# Patient Record
Sex: Male | Born: 1980 | Race: White | Hispanic: No | Smoking: Never smoker
Health system: Southern US, Community
[De-identification: ages and names within clinical notes are randomized; demographics above are authoritative.]

## PROBLEM LIST (undated history)

## (undated) DIAGNOSIS — E785 Hyperlipidemia, unspecified: Secondary | ICD-10-CM

## (undated) DIAGNOSIS — R208 Other disturbances of skin sensation: Secondary | ICD-10-CM

## (undated) HISTORY — DX: Hyperlipidemia, unspecified: E78.5

## (undated) HISTORY — DX: Other disturbances of skin sensation: R20.8

---
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
## 1999-09-14 ENCOUNTER — Encounter: Payer: Self-pay | Admitting: Orthopedic Surgery

## 1999-09-14 ENCOUNTER — Ambulatory Visit (HOSPITAL_COMMUNITY): Admission: RE | Admit: 1999-09-14 | Discharge: 1999-09-14 | Payer: Self-pay | Admitting: Orthopedic Surgery

## 1999-12-26 HISTORY — PX: WRIST SURGERY: SHX841

## 2000-01-04 ENCOUNTER — Ambulatory Visit (HOSPITAL_BASED_OUTPATIENT_CLINIC_OR_DEPARTMENT_OTHER): Admission: RE | Admit: 2000-01-04 | Discharge: 2000-01-04 | Payer: Self-pay | Admitting: Orthopedic Surgery

## 2000-03-07 ENCOUNTER — Emergency Department (HOSPITAL_COMMUNITY): Admission: EM | Admit: 2000-03-07 | Discharge: 2000-03-08 | Payer: Self-pay | Admitting: Emergency Medicine

## 2000-03-07 ENCOUNTER — Encounter: Payer: Self-pay | Admitting: Emergency Medicine

## 2000-05-09 ENCOUNTER — Emergency Department (HOSPITAL_COMMUNITY): Admission: EM | Admit: 2000-05-09 | Discharge: 2000-05-10 | Payer: Self-pay | Admitting: *Deleted

## 2003-07-27 ENCOUNTER — Emergency Department (HOSPITAL_COMMUNITY): Admission: EM | Admit: 2003-07-27 | Discharge: 2003-07-27 | Payer: Self-pay | Admitting: Emergency Medicine

## 2004-05-27 HISTORY — PX: SHOULDER SURGERY: SHX246

## 2004-11-13 ENCOUNTER — Ambulatory Visit (HOSPITAL_COMMUNITY): Admission: RE | Admit: 2004-11-13 | Discharge: 2004-11-13 | Payer: Self-pay | Admitting: Specialist

## 2005-03-01 ENCOUNTER — Emergency Department: Payer: Self-pay | Admitting: Emergency Medicine

## 2006-05-07 ENCOUNTER — Encounter: Payer: Self-pay | Admitting: Family Medicine

## 2006-10-16 ENCOUNTER — Emergency Department: Payer: Self-pay | Admitting: Emergency Medicine

## 2007-04-09 ENCOUNTER — Ambulatory Visit: Payer: Self-pay | Admitting: Family Medicine

## 2007-04-09 DIAGNOSIS — E669 Obesity, unspecified: Secondary | ICD-10-CM | POA: Insufficient documentation

## 2007-04-09 DIAGNOSIS — E785 Hyperlipidemia, unspecified: Secondary | ICD-10-CM

## 2007-04-09 DIAGNOSIS — M1250 Traumatic arthropathy, unspecified site: Secondary | ICD-10-CM | POA: Insufficient documentation

## 2007-04-16 ENCOUNTER — Ambulatory Visit: Payer: Self-pay | Admitting: Family Medicine

## 2007-04-21 LAB — CONVERTED CEMR LAB
ALT: 40 units/L (ref 0–53)
AST: 32 units/L (ref 0–37)
Albumin: 4.2 g/dL (ref 3.5–5.2)
Alkaline Phosphatase: 96 units/L (ref 39–117)
BUN: 11 mg/dL (ref 6–23)
Bilirubin, Direct: 0.1 mg/dL (ref 0.0–0.3)
CO2: 28 meq/L (ref 19–32)
Calcium: 9.2 mg/dL (ref 8.4–10.5)
Chloride: 104 meq/L (ref 96–112)
Cholesterol: 193 mg/dL (ref 0–200)
Creatinine, Ser: 0.8 mg/dL (ref 0.4–1.5)
GFR calc Af Amer: 149 mL/min
GFR calc non Af Amer: 123 mL/min
Glucose, Bld: 99 mg/dL (ref 70–99)
HDL: 33.7 mg/dL — ABNORMAL LOW (ref >39.0)
LDL Cholesterol: 129 mg/dL — ABNORMAL HIGH (ref 0–99)
Potassium: 3.8 meq/L (ref 3.5–5.1)
Sodium: 140 meq/L (ref 135–145)
Total Bilirubin: 1.2 mg/dL (ref 0.3–1.2)
Total CHOL/HDL Ratio: 5.7
Total Protein: 7.2 g/dL (ref 6.0–8.3)
Triglycerides: 152 mg/dL — ABNORMAL HIGH (ref 0–149)
VLDL: 30 mg/dL (ref 0–40)

## 2007-10-01 ENCOUNTER — Telehealth: Payer: Self-pay | Admitting: Family Medicine

## 2007-11-26 ENCOUNTER — Ambulatory Visit: Payer: Self-pay | Admitting: Family Medicine

## 2008-02-15 ENCOUNTER — Ambulatory Visit: Payer: Self-pay | Admitting: Family Medicine

## 2008-03-17 ENCOUNTER — Telehealth: Payer: Self-pay | Admitting: Family Medicine

## 2008-07-29 ENCOUNTER — Telehealth: Payer: Self-pay | Admitting: Family Medicine

## 2008-09-06 ENCOUNTER — Ambulatory Visit: Payer: Self-pay | Admitting: Family Medicine

## 2008-10-05 ENCOUNTER — Telehealth: Payer: Self-pay | Admitting: Family Medicine

## 2008-10-06 ENCOUNTER — Ambulatory Visit: Payer: Self-pay | Admitting: Family Medicine

## 2008-11-23 ENCOUNTER — Ambulatory Visit: Payer: Self-pay | Admitting: Family Medicine

## 2008-11-23 DIAGNOSIS — K219 Gastro-esophageal reflux disease without esophagitis: Secondary | ICD-10-CM

## 2008-12-23 ENCOUNTER — Telehealth: Payer: Self-pay | Admitting: Family Medicine

## 2009-02-17 ENCOUNTER — Telehealth: Payer: Self-pay | Admitting: Family Medicine

## 2009-03-21 ENCOUNTER — Telehealth: Payer: Self-pay | Admitting: Family Medicine

## 2009-04-13 ENCOUNTER — Ambulatory Visit: Payer: Self-pay | Admitting: Family Medicine

## 2009-05-02 ENCOUNTER — Telehealth: Payer: Self-pay | Admitting: Family Medicine

## 2009-08-31 ENCOUNTER — Encounter: Payer: Self-pay | Admitting: Family Medicine

## 2009-08-31 ENCOUNTER — Emergency Department (HOSPITAL_COMMUNITY): Admission: EM | Admit: 2009-08-31 | Discharge: 2009-08-31 | Payer: Self-pay | Admitting: Emergency Medicine

## 2009-09-01 ENCOUNTER — Ambulatory Visit: Payer: Self-pay | Admitting: Family Medicine

## 2009-09-06 ENCOUNTER — Telehealth: Payer: Self-pay | Admitting: Family Medicine

## 2009-09-08 ENCOUNTER — Ambulatory Visit: Payer: Self-pay | Admitting: Cardiology

## 2009-10-10 ENCOUNTER — Emergency Department: Payer: Self-pay | Admitting: Internal Medicine

## 2009-10-12 ENCOUNTER — Emergency Department: Payer: Self-pay | Admitting: Emergency Medicine

## 2009-11-06 ENCOUNTER — Ambulatory Visit (HOSPITAL_COMMUNITY): Admission: RE | Admit: 2009-11-06 | Discharge: 2009-11-06 | Payer: Self-pay | Admitting: Cardiology

## 2009-11-06 ENCOUNTER — Ambulatory Visit: Payer: Self-pay

## 2009-11-06 ENCOUNTER — Ambulatory Visit: Payer: Self-pay | Admitting: Cardiovascular Disease

## 2009-11-06 ENCOUNTER — Ambulatory Visit: Payer: Self-pay | Admitting: Family Medicine

## 2009-11-06 ENCOUNTER — Encounter: Payer: Self-pay | Admitting: Cardiology

## 2009-11-06 LAB — CONVERTED CEMR LAB
Bilirubin Urine: NEGATIVE
Glucose, Urine, Semiquant: NEGATIVE
Ketones, urine, test strip: NEGATIVE
Specific Gravity, Urine: 1.015
pH: 6.5

## 2010-01-03 ENCOUNTER — Ambulatory Visit: Payer: Self-pay | Admitting: Family Medicine

## 2010-01-08 ENCOUNTER — Ambulatory Visit: Payer: Self-pay | Admitting: Family Medicine

## 2010-01-08 LAB — CONVERTED CEMR LAB
ALT: 16 units/L (ref 0–53)
AST: 18 units/L (ref 0–37)
Albumin: 4 g/dL (ref 3.5–5.2)
BUN: 11 mg/dL (ref 6–23)
CO2: 29 meq/L (ref 19–32)
Chloride: 106 meq/L (ref 96–112)
Cholesterol: 149 mg/dL (ref 0–200)
Glucose, Bld: 91 mg/dL (ref 70–99)
Potassium: 4.3 meq/L (ref 3.5–5.1)
Total Bilirubin: 0.4 mg/dL (ref 0.3–1.2)
Total Protein: 6.6 g/dL (ref 6.0–8.3)

## 2010-06-28 NOTE — Assessment & Plan Note (Signed)
Summary: NEW PT   Primary Provider:  Dr. Dayton Martes  CC:  Syncope/Chest Pain w/ (L) Arm radiation.  History of Present Illness: 30 yo presents for evaluation of chest pain and presyncope.  Patient has had 4 episodes of chest tightness in the last few weeks.  All have been severe and have been with exertion.  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  It has lasted for 10-20 minutes and resolved with rest. He has never had these symptoms before.  In general, he has been in good physical condition.  He works outdoors daily and gets a lot of exercise.  During one episode of chest pain, he actually passed out.  During another episode, he was lightheaded.  He has no history of premature coronary disease.  He went to the ER 4/7 after one of these episodes.  CXR and ECG were normal.  No blood was drawn.  Of note, he has been distraught recently because his wife left him and is trying to keep him from seeing his daughter.    ECG: NSR, right axis deviation, T wave inversions in III and AVF  Current Medications (verified): 1)  Aspirin Ec 325 Mg Tbec (Aspirin) .... Take One Tablet By Mouth Daily (Takes Sometime Three Times A Day)  Allergies: 1)  ! Hydrocodone-Acetaminophen (Hydrocodone-Acetaminophen) 2)  ! Codeine 3)  ! Septra 4)  ! Benadryl  Past History:  Past Medical History: 1. Hyperlipidemia 2. Chest pain  Family History: father: alcoholic.  Died in his 46s.  Had MI in early 55s.   mother age 63 healthy borhter and sister with high chol MGF: melanoma PGF: DM MGM: schizophrenia, bipolar Uncle with MI in his 73s Grandfather with MI.   Social History: 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  Never Smoked Alcohol use-yes, 1 beer every 6 months Drug use-no Regular exercise-yes, yard work Diet: eats a lot of fast food  Review of Systems       All systems reviewed and negative except as  per HPI.   Vital Signs:  Patient profile:   30 year old male Height:      71 inches Weight:      222 pounds BMI:     31.07 Pulse rate:   113 / minute BP sitting:   134 / 84  (left arm)  Vitals Entered By: Stanton Kidney, EMT-P (September 08, 2009 3:06 PM)  Serial Vital Signs/Assessments:  Time      Position  BP       Pulse  Resp  Temp     By 3:18 PM             133/89   103                   Stanton Kidney, EMT-P 3:18 PM             142/95   118                   Stanton Kidney, EMT-P 3:19 PM             132/95   125                   Stanton Kidney, EMT-P 3:21 PM             134/89   131  Stanton Kidney, EMT-P 3:22 PM             144/106  108                   Stanton Kidney, EMT-P  Comments: 3:18 PM Laying (L) Arm By: Stanton Kidney, EMT-P  3:18 PM Sitting (L) Arm By: Stanton Kidney, EMT-P  3:19 PM 0 min Standing (L) Arm By: Stanton Kidney, EMT-P  3:21 PM 2 min Standing (L) Arm By: Stanton Kidney, EMT-P  3:22 PM 5 min Standing (L) Arm By: Stanton Kidney, EMT-P    Physical Exam  General:  Well developed, well nourished, in no acute distress. Nose:  no deformity, discharge, inflammation, or lesions Mouth:  Teeth, gums and palate normal. Oral mucosa normal. Neck:  Neck supple, no JVD. No masses, thyromegaly or abnormal cervical nodes. Lungs:  Clear bilaterally to auscultation and percussion. Heart:  Non-displaced PMI, chest non-tender; regular rate and rhythm, S1, S2 without murmurs, rubs or gallops. Carotid upstroke normal, no bruit. Pedals normal pulses. No edema, no varicosities. Abdomen:  Bowel sounds positive; abdomen soft and non-tender without masses, organomegaly, or hernias noted. No hepatosplenomegaly. Msk:  Back normal, normal gait. Muscle strength and tone normal. Extremities:  No clubbing or cyanosis. Neurologic:  Alert and oriented x 3. Skin:  Intact without lesions or rashes. Psych:  anxious.     Impression & Recommendations:  Problem # 1:  CHEST PAIN  (ICD-786.50) Patient has had 4 episodes of chest pain with exertion, 2 episodes also with syncope or presyncope.  He has no risk factors for coronary disease.  He has no history of heart disease.  Given the exertional symptoms, I will have him do a stress echocardiogram to assess for ischemia and also to make sure that his heart is structurally normal.  It is certainly possible that his symptoms could be stress-related given the difficulties in his personal life.

## 2010-06-28 NOTE — Assessment & Plan Note (Signed)
Summary: chest pain on and off x 2 weeks   Vital Signs:  Patient profile:   30 year old male Height:      71 inches Weight:      227 pounds BMI:     31.77 Temp:     98.6 degrees F oral Pulse rate:   80 / minute Pulse rhythm:   regular BP sitting:   130 / 84  (left arm) Cuff size:   large  Vitals Entered By: Delilah Shan CMA Duncan Dull) (September 01, 2009 11:42 AM) CC: Chest pain on and off x 2 weeks   History of Present Illness: 30 yo here for 2 weeks of chest pain.  Wife left him a month ago, has been very depressed but starting to put his life back together. Working more last few weeks, works in [employer]. Several times in past two weeks, while exerting himself, gets crushing chest pain that radiates down left arm.  Associated with diaphoresis and dizziness.  Did have a syncopal episode during one of these occassions.  Does appear to be improved with rest.  PMH- reports that he thinks his dad died of MI and ETOH abuse in his 78s.  Of note, he has lost 30 pounds in one month since his wife left him.  This is an intentionally drastic weight loss--cut out all soft drinks, drinking Slim fast.  Went to ER last night because had one of these episodes- ER notes reviewed-- EKG Normal Sinus rhythm.  CXR and BMET normal. Given nebs treatment, reports it did not help with his chest tightness.   No cardiac enzymes drawn.  Current Medications (verified): 1)  None  Allergies: 1)  ! Hydrocodone-Acetaminophen (Hydrocodone-Acetaminophen) 2)  ! Codeine 3)  ! Septra 4)  ! Benadryl  Review of Systems      See HPI General:  Denies fever and malaise. Eyes:  Denies blurring. ENT:  Denies difficulty swallowing. CV:  Complains of chest pain or discomfort, fainting, and shortness of breath with exertion; denies leg cramps with exertion, lightheadness, near fainting, and palpitations. Resp:  Denies cough. GI:  Denies abdominal pain, nausea, and vomiting. Derm:  Denies rash. Neuro:  Denies  headaches and sensation of room spinning. Psych:  Complains of anxiety and depression; denies suicidal thoughts/plans, thoughts of violence, unusual visions or sounds, and thoughts /plans of harming others.  Physical Exam  General:  overweight appearing male in NAD Eyes:  No corneal or conjunctival inflammation noted. EOMI. Perrla. Funduscopic exam benign, without hemorrhages, exudates or papilledema. Vision grossly normal. No coular pain...pain is on left brow Mouth:  MMM Neck:  no carotid bruit or thyromegaly  Lungs:  Normal respiratory effort, chest expands symmetrically. Lungs are clear to auscultation, no crackles or wheezes. Heart:  Normal rate and regular rhythm. S1 and S2 normal without gallop, murmur, click, rub or other extra sounds. Abdomen:  Bowel sounds positive,abdomen soft and non-tender without masses, organomegaly or hernias noted. Extremities:  no edmea  Psych:  good eye contact, not anxious appearing, and not depressed appearing.     Impression & Recommendations:  Problem # 1:  CHEST PAIN (ICD-786.50) Assessment New Although EKG normal, he is having exertional chest pain. I would like him to be worked up by cardiology. Stress is most definitely playing a roll along with calorie malnutrition.  Discussed both of these. Does not have abnormal EKG findings (Takotsubo). Orders: Cardiology Referral (Cardiology)  Patient Instructions: 1)  Please stop by to see Shirlee Limerick on your way out  to set up your cardiology referral. 2)  Please try to increase your calories and amount of water you are drinking.  Prior Medications (reviewed today): None Current Allergies (reviewed today): ! HYDROCODONE-ACETAMINOPHEN (HYDROCODONE-ACETAMINOPHEN) ! CODEINE ! SEPTRA ! BENADRYL
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
## 2010-06-28 NOTE — Progress Notes (Signed)
Summary: pt requests letter  Phone Note Call from Patient Call back at Work Phone [phone]   Caller: Patient Summary of Call: Pt states that his wife is not letting him have visitation with his daughter because he has been having chest pains and she thinks something could happen to him when he is alone with his daughter.  His lawyer has told him to get a letter from his doctor saying he is healthy enough to keep his daughter.  I told him he may should get this from the cardiologist when he sees him on friday, but he wanted me to ask you first if you would write this letter for him. He says he only has the pain when he is working, not when he's at home.   Please advise. Initial call taken by: Lowella Petties CMA,  September 06, 2009 12:06 PM  Follow-up for Phone Call        As soon as he is cleared by cardiology, I would be happy to write a letter. Follow-up by: Ruthe Mannan MD,  September 06, 2009 12:10 PM  Additional Follow-up for Phone Call Additional follow up Details #1::        Advised pt. Additional Follow-up by: Lowella Petties CMA,  September 06, 2009 12:33 PM

## 2010-06-28 NOTE — Assessment & Plan Note (Signed)
Summary: 8:15 CPX/RBH   Vital Signs:  Patient profile:   30 year old male Height:      71 inches Weight:      231.6 pounds BMI:     32.42 Temp:     97.8 degrees F oral Pulse rate:   88 / minute Pulse rhythm:   regular BP sitting:   110 / 72  (left arm) Cuff size:   regular  Vitals Entered By: Benny Lennert CMA Duncan Dull) (January 08, 2010 8:38 AM)  History of Present Illness: Chief complaint cpx  The patient is here for annual wellness exam and preventative care.     Eating well. Exercising 5 days a week every other week.  Can run a mile without sstopping...working towards qualifying for [employer].  Flank pain injury from work....resolved.   Currently in court appointed therapy. Has joint custody of daughter. Denies depression, SI, anxiety.   Allergies: 1)  ! Hydrocodone-Acetaminophen (Hydrocodone-Acetaminophen) 2)  ! Codeine 3)  ! Septra 4)  ! Benadryl  Past History:  Past medical, surgical, family and social histories (including risk factors) reviewed, and no changes noted (except as noted below).  Past Medical History: Reviewed history from 09/08/2009 and no changes required. 1. Hyperlipidemia 2. Chest pain  Past Surgical History: Reviewed history from 04/09/2007 and no changes required. June 1991 elbow reconstruction, broken wrist 12/1999 wrist surgery decreased sensation in left arm 2006 shoulder surgery , bursa removal  Family History: Reviewed history from 09/08/2009 and no changes required. father: alcoholic.  Died in his 62s.  Had MI in early 34s.   mother age 63 healthy borhter and sister with high chol MGF: melanoma PGF: DM MGM: schizophrenia, bipolar Uncle with MI in his 25s Grandfather with MI.   Social History: Reviewed history from 09/08/2009 and no changes required. 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  Never Smoked Alcohol use-yes, 1-2 beers daily, with occ binging once a month Drug use-no Regular  exercise-yes, yard work Diet: improved diet, less fast food.   Review of Systems General:  Denies fatigue and fever. CV:  Denies chest pain or discomfort and swelling of feet. Resp:  Denies shortness of breath. GI:  Denies abdominal pain, bloody stools, constipation, and diarrhea. GU:  Denies decreased libido, dysuria, and genital sores. Derm:  Denies lesion(s). Psych:  Denies anxiety and depression.  Physical Exam  General:  Overweight appearing male in NAD Ears:  External ear exam shows no significant lesions or deformities.  Otoscopic examination reveals clear canals, tympanic membranes are intact bilaterally without bulging, retraction, inflammation or discharge. Hearing is grossly normal bilaterally. Nose:  External nasal examination shows no deformity or inflammation. Nasal mucosa are pink and moist without lesions or exudates. Mouth:  Oral mucosa and oropharynx without lesions or exudates.  Teeth in good repair. Neck:  no carotid bruit or thyromegaly no cervical or supraclavicular lymphadenopathy   Lungs:  Normal respiratory effort, chest expands symmetrically. Lungs are clear to auscultation, no crackles or wheezes. Heart:  Normal rate and regular rhythm. S1 and S2 normal without gallop, murmur, click, rub or other extra sounds. Abdomen:  Bowel sounds positive,abdomen soft and non-tender without masses, organomegaly or hernias noted. Genitalia:  Testes bilaterally descended without nodularity, tenderness or masses. No scrotal masses or lesions. No penis lesions or urethral discharge. Prostate:  Prostate gland firm and smooth, no enlargement, nodularity, tenderness, mass, asymmetry or induration. Msk:  No deformity or scoliosis noted of thoracic or lumbar spine.   Pulses:  R  and L posterior tibial pulses are full and equal bilaterally  Extremities:  no edema Neurologic:  No cranial nerve deficits noted. Station and gait are normal. Plantar reflexes are down-going bilaterally. DTRs  are symmetrical throughout. Sensory, motor and coordinative functions appear intact. Skin:  Intact without suspicious lesions or rashes Psych:  Cognition and judgment appear intact. Alert and cooperative with normal attention span and concentration. No apparent delusions, illusions, hallucinations   Impression & Recommendations:  Problem # 1:  Preventive Health Care (ICD-V70.0) The patient's preventative maintenance and recommended screening tests for an annual wellness exam were reviewed in full today. Brought up to date unless services declined.  Counselled on the importance of diet, exercise, and its role in overall health and mortality. The patient's FH and SH was reviewed, including their home life, tobacco status, and drug and alcohol status.     Problem # 2:  HYPERLIPIDEMIA (ICD-272.4) Improved with lifestyle change. Recheck in 1 year.   Problem # 3:  ELEVATED BLOOD PRESSURE WITHOUT DIAGNOSIS OF HYPERTENSION (ICD-796.2) Resovled...likely elevated due to pain. Encouraged exercise, weight loss, healthy eating habits.   Other Orders: Pain Clinic Referral (Pain)  Patient Instructions: 1)  Please schedule a follow-up appointment in 1 year.  2)   Continue to exercise and lose weight.  3)  Work on [employer]   Current Allergies (reviewed today): ! HYDROCODONE-ACETAMINOPHEN (HYDROCODONE-ACETAMINOPHEN) ! CODEINE ! SEPTRA ! BENADRYL

## 2010-08-15 LAB — CBC
Hemoglobin: 16.4 g/dL (ref 13.0–17.0)
MCHC: 33.9 g/dL (ref 30.0–36.0)
RDW: 13.6 % (ref 11.5–15.5)
WBC: 8.3 10*3/uL (ref 4.0–10.5)

## 2010-08-15 LAB — DIFFERENTIAL
Eosinophils Absolute: 0 10*3/uL (ref 0.0–0.7)
Lymphs Abs: 3 10*3/uL (ref 0.7–4.0)
Monocytes Relative: 7 % (ref 3–12)
Neutro Abs: 4.7 10*3/uL (ref 1.7–7.7)
Neutrophils Relative %: 56 % (ref 43–77)

## 2010-08-15 LAB — COMPREHENSIVE METABOLIC PANEL
ALT: 46 U/L (ref 0–53)
Calcium: 9.3 mg/dL (ref 8.4–10.5)
Glucose, Bld: 109 mg/dL — ABNORMAL HIGH (ref 70–99)
Sodium: 140 mEq/L (ref 135–145)
Total Protein: 7.3 g/dL (ref 6.0–8.3)

## 2010-10-12 NOTE — Op Note (Signed)
[place]  Patient:    Gregory Chapman, Gregory Chapman                       MRN: [id number] Proc. Date: 01/04/00 Adm. Date:  [id number] Disc. Date: [id number] Attending:  Ronne Binning                           Operative Report  PREOPERATIVE DIAGNOSIS:  Status post fracture, left wrist.  POSTOPERATIVE DIAGNOSIS:  Status post fracture, left wrist.  OPERATION:  Arthroscopy with repair of TFCC.  It is noted that the patient also has partial scapholunate ligament tear, left wrist.  Repair of triangular fibrocartilage using Tuohy needle technique, partial debridement of scapholunate tear, left wrist.  SURGEON:  Nicki Reaper, M.D.  ANESTHESIA:  Axillary block.  ANESTHESIOLOGIST:  Janetta Hora. Gelene Mink, M.D.  HISTORY:;  The patient is a 30 year old male with a history of wrist pain.  He has a long ulna.  He has a tear of the triangular fibrocartilage complex on MRI.  PROCEDURE IN DETAIL:  The patient was brought to the operating room where an axillary block was carried out without difficulty.  He was prepped and draped using Betadine scrub and solution with the left arm free in the supine position.  His limb was placed in the arthroscopy tower, 10 pounds of traction applied, tourniquet inflated through 3-4 portal.  A transverse incision was made and deepened with a hemostat.  A blunt trocar was used to center the joint.  The joint was inspected.  A partial scapholunate ligament tear was identified. This did not involve the dorsal aspect.  Moderate synovitis was present radially and ulnarly.  The ulnar head was noted to be significantly apparent with a significant ulna plus deformity.  There was no tear of the triangular fibrocartilage complex, however.  The irrigation catheter was placed in 6U.  A 4-5 portal was opened.  A very significant synovitis was present along the entire ulnar aspect.  An ArthroWand shaver was then used to remove the synovial tissue.   A peripheral tear was present and identified. The lunotriquentral joint appeared to be intact.  The mid-carpal joint was then inspected.  Minimal instability was present at the scapholunate ligament complex.  There was no change in the [facility] joint.  The lunotriquetral joint showed no instability.  The proximal capitate and hamate showed no changes.  The instruments were then reinserted into the proximal 3-4 portal.  A 20 gauge Tuohy needle was then bent.  This was inserted through the 1-2 portal and traversed across the wrist joint, guided with a nerve hook, and with a grasper.  This was then used to repair the triangular fibrocartilage peripheral tear with a 2-0 Vicryl suture.  This was passed out through the skin.  An incision was made.  This was deepened down to the capsule and tied over the capsule securing the TFCC back to the dorsal capsule.  The wounds were then irrigated.  The instruments removed.  The portals closed.  It was decided not to proceed with a formal ulnar shortening at this time.  A sterile compressive dressing long arm splint applied.  The patient tolerated the procedure well and was taken to the recovery room for observation in satisfactory condition.  He is discharged home to return to the [facility] in [place] in 1 week on Talwin NX, and Keflex. DD:  01/04/00 TD:  01/05/00 Job:  [id number]

## 2010-12-07 ENCOUNTER — Encounter: Payer: Self-pay | Admitting: Cardiology

## 2011-06-19 ENCOUNTER — Emergency Department (HOSPITAL_COMMUNITY)
Admission: EM | Admit: 2011-06-19 | Discharge: 2011-06-19 | Disposition: A | Discharge status: Home / Self Care | Payer: No Typology Code available for payment source | Attending: Emergency Medicine | Admitting: Emergency Medicine

## 2011-06-19 ENCOUNTER — Encounter (HOSPITAL_COMMUNITY): Payer: Self-pay | Admitting: Emergency Medicine

## 2011-06-19 DIAGNOSIS — E785 Hyperlipidemia, unspecified: Secondary | ICD-10-CM | POA: Insufficient documentation

## 2011-06-19 DIAGNOSIS — M25519 Pain in unspecified shoulder: Secondary | ICD-10-CM | POA: Insufficient documentation

## 2011-06-19 DIAGNOSIS — M549 Dorsalgia, unspecified: Secondary | ICD-10-CM | POA: Insufficient documentation

## 2011-06-19 DIAGNOSIS — Y92410 Unspecified street and highway as the place of occurrence of the external cause: Secondary | ICD-10-CM | POA: Insufficient documentation

## 2011-06-19 MED ORDER — IBUPROFEN 800 MG PO TABS: 800.0000 mg | ORAL_TABLET | Freq: Three times a day (TID) | ORAL | Status: AC

## 2011-06-19 MED ORDER — DIAZEPAM 5 MG PO TABS: 5.0000 mg | ORAL_TABLET | Freq: Every day | ORAL | Status: AC
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
## 2011-12-07 IMAGING — CR DG CHEST 2V
1 series · 2 of 2 positions shown · non-contrast
Comparison: none

REASON FOR EXAM: cp
COMMENTS:

[Series 1: view not recorded · 0.17mm/px · 2 of 2 slices shown]
[im 1/2]
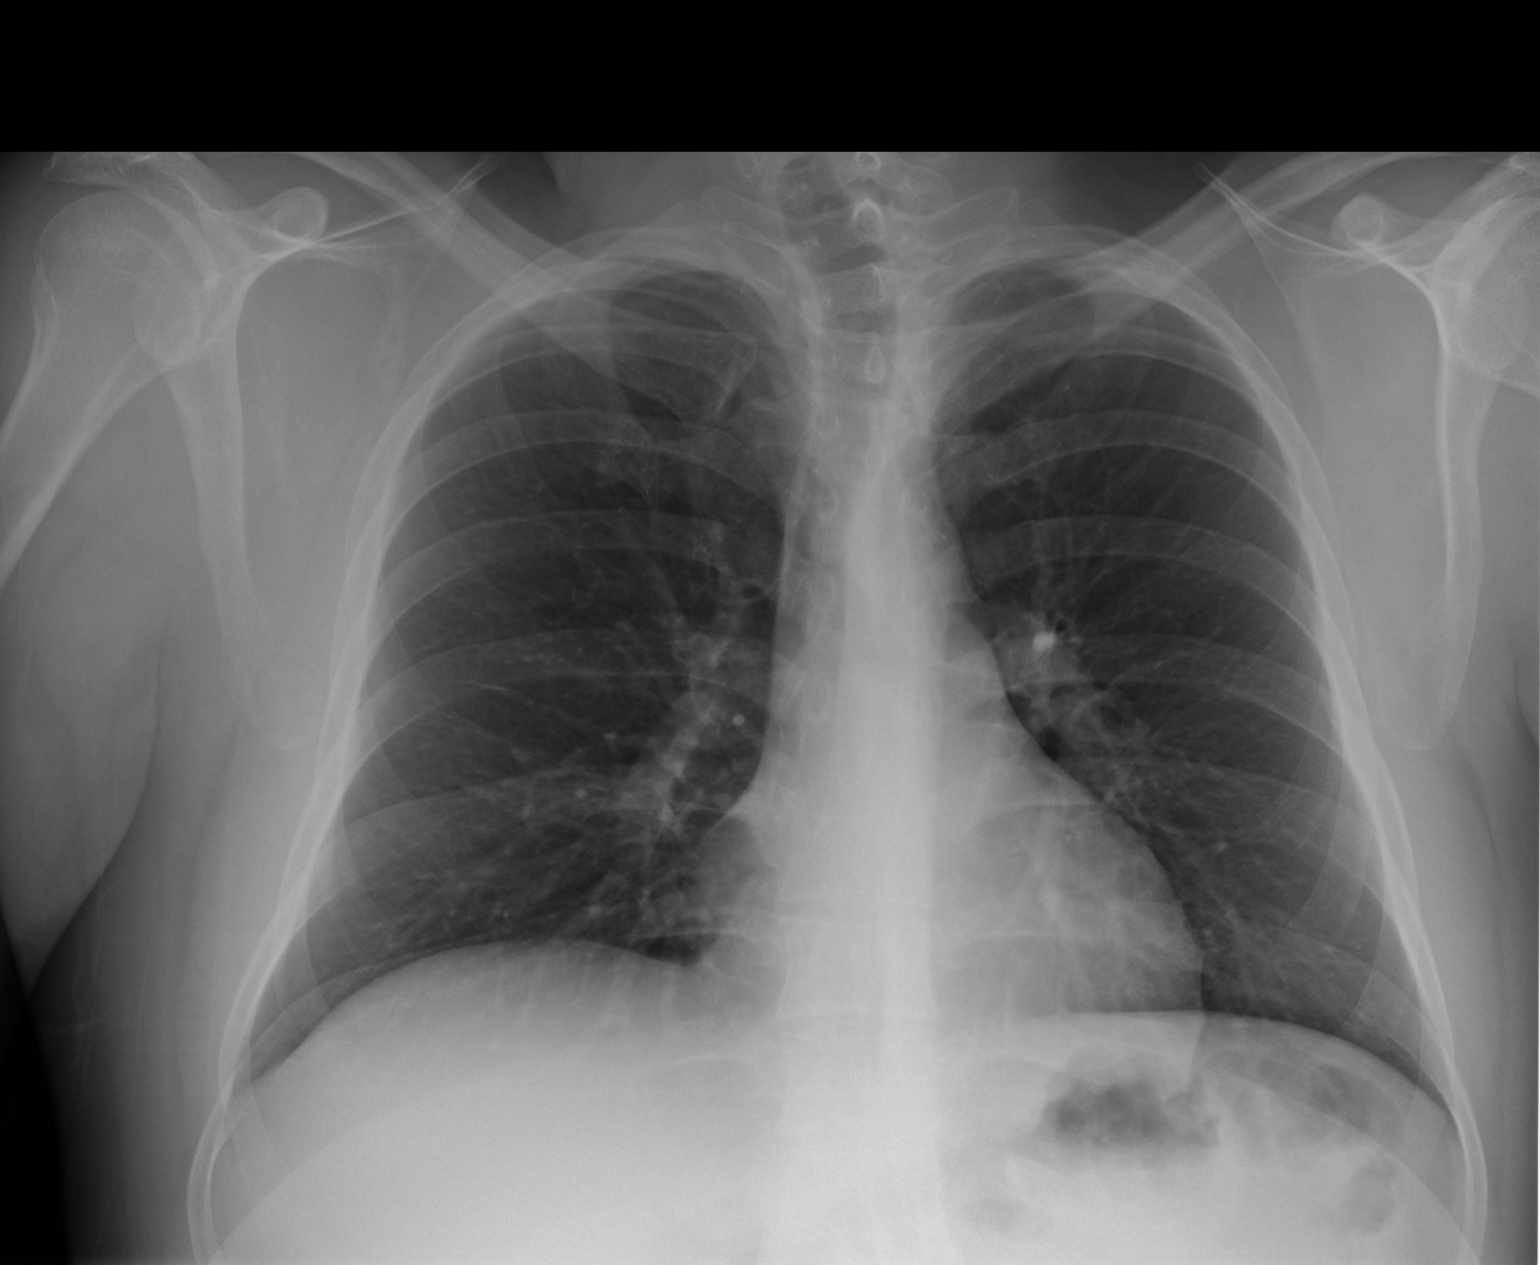
[im 2/2]
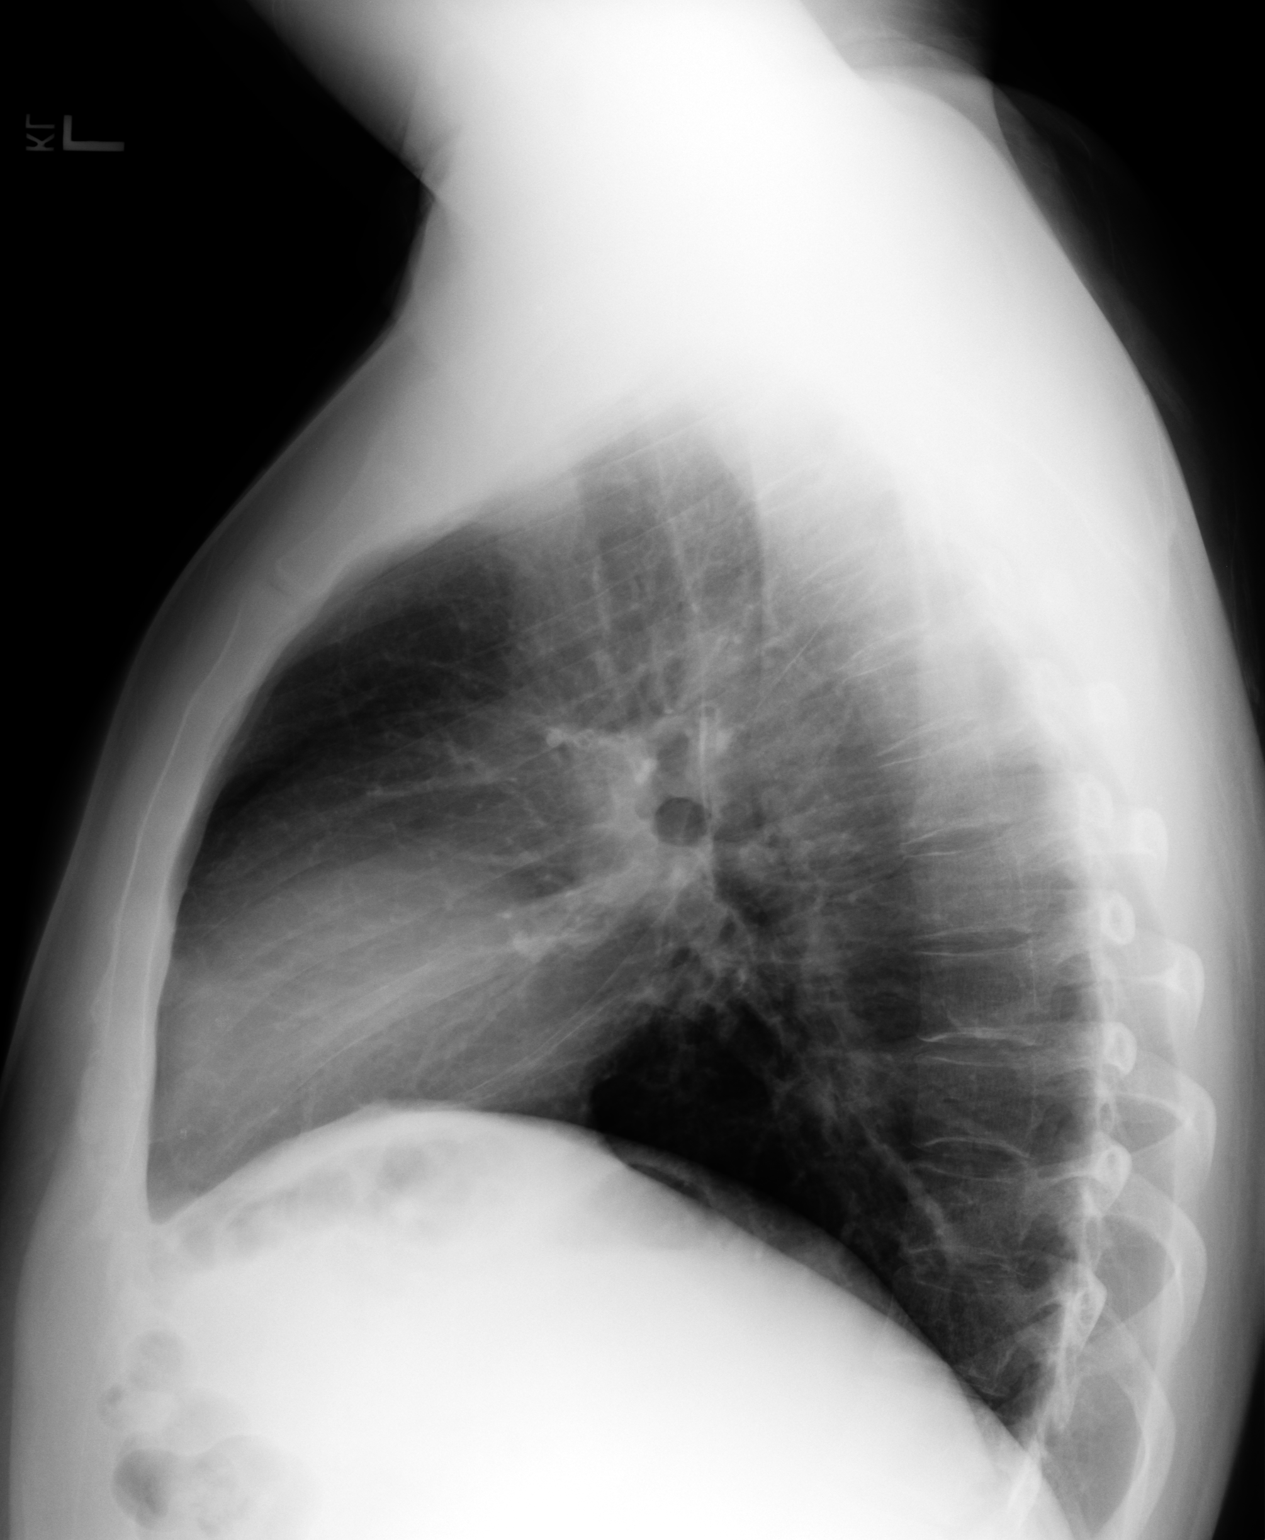

[2 of 2 positions shown; findings below may reference images not displayed]

PROCEDURE:     DXR - DXR CHEST PA (OR AP) AND LATERAL  - October 10, 2009  [DATE]

RESULT:     The lungs are adequately inflated and clear. The heart is normal
in size. The pulmonary vascularity is not engorged. The mediastinum is not
widened. There is no evidence of a pneumothorax. There is no pleural
effusion. The visualized portions of the thoracic spine appears normal.
IMPRESSION: I see no acute cardiopulmonary abnormality.

## 2013-11-05 ENCOUNTER — Encounter (HOSPITAL_COMMUNITY): Payer: Self-pay | Admitting: Emergency Medicine

## 2013-11-05 ENCOUNTER — Ambulatory Visit: Payer: Self-pay

## 2013-11-05 ENCOUNTER — Emergency Department (HOSPITAL_COMMUNITY)
Admission: EM | Admit: 2013-11-05 | Discharge: 2013-11-05 | Disposition: A | Discharge status: Home / Self Care | Payer: Self-pay | Attending: Emergency Medicine | Admitting: Emergency Medicine

## 2013-11-05 DIAGNOSIS — Y929 Unspecified place or not applicable: Secondary | ICD-10-CM | POA: Insufficient documentation

## 2013-11-05 DIAGNOSIS — S61209A Unspecified open wound of unspecified finger without damage to nail, initial encounter: Secondary | ICD-10-CM | POA: Insufficient documentation

## 2013-11-05 DIAGNOSIS — Y93G9 Activity, other involving cooking and grilling: Secondary | ICD-10-CM | POA: Insufficient documentation

## 2013-11-05 DIAGNOSIS — W261XXA Contact with sword or dagger, initial encounter: Secondary | ICD-10-CM

## 2013-11-05 DIAGNOSIS — W260XXA Contact with knife, initial encounter: Secondary | ICD-10-CM | POA: Insufficient documentation

## 2013-11-05 DIAGNOSIS — E785 Hyperlipidemia, unspecified: Secondary | ICD-10-CM | POA: Insufficient documentation

## 2013-11-05 DIAGNOSIS — S61219A Laceration without foreign body of unspecified finger without damage to nail, initial encounter: Secondary | ICD-10-CM

## 2013-11-05 NOTE — Discharge Instructions (Signed)
Laceration Care, Adult °A laceration is a cut that goes through all layers of the skin. The cut goes into the tissue beneath the skin. °HOME CARE °For stitches (sutures) or staples: °· Keep the cut clean and dry. °· If you have a bandage (dressing), change it at least once a day. Change the bandage if it gets wet or dirty, or as told by your doctor. °· Wash the cut with soap and water 2 times a day. Rinse the cut with water. Pat it dry with a clean towel. °· Put a thin layer of medicated cream on the cut as told by your doctor. °· You may shower after the first 24 hours. Do not soak the cut in water until the stitches are removed. °· Only take medicines as told by your doctor. °· Have your stitches or staples removed as told by your doctor. °For skin adhesive strips: °· Keep the cut clean and dry. °· Do not get the strips wet. You may take a bath, but be careful to keep the cut dry. °· If the cut gets wet, pat it dry with a clean towel. °· The strips will fall off on their own. Do not remove the strips that are still stuck to the cut. °For wound glue: °· You may shower or take baths. Do not soak or scrub the cut. Do not swim. Avoid heavy sweating until the glue falls off on its own. After a shower or bath, pat the cut dry with a clean towel. °· Do not put medicine on your cut until the glue falls off. °· If you have a bandage, do not put tape over the glue. °· Avoid lots of sunlight or tanning lamps until the glue falls off. Put sunscreen on the cut for the first year to reduce your scar. °· The glue will fall off on its own. Do not pick at the glue. °You may need a tetanus shot if: °· You cannot remember when you had your last tetanus shot. °· You have never had a tetanus shot. °If you need a tetanus shot and you choose not to have one, you may get tetanus. Sickness from tetanus can be serious. °GET HELP RIGHT AWAY IF:  °· Your pain does not get better with medicine. °· Your arm, hand, leg, or foot loses feeling  (numbness) or changes color. °· Your cut is bleeding. °· Your joint feels weak, or you cannot use your joint. °· You have painful lumps on your body. °· Your cut is red, puffy (swollen), or painful. °· You have a red line on the skin near the cut. °· You have yellowish-white fluid (pus) coming from the cut. °· You have a fever. °· You have a bad smell coming from the cut or bandage. °· Your cut breaks open before or after stitches are removed. °· You notice something coming out of the cut, such as wood or glass. °· You cannot move a finger or toe. °MAKE SURE YOU:  °· Understand these instructions. °· Will watch your condition. °· Will get help right away if you are not doing well or get worse. °Document Released: 10/30/2007 Document Revised: 08/05/2011 Document Reviewed: 11/06/2010 °Exit[employer]® Patient Information ©2014 [employer]. ° ° ° ° °

## 2013-11-05 NOTE — ED Provider Notes (Signed)
CSN: [id number]     Arrival date & time 11/05/13  1417 History  This chart was scribed for non-physician practitioner, Teressa LowerVrinda Quantasia Stegner, FNP,working with Linwood DibblesJon Knapp, MD, by Karle PlumberJennifer Tensley, ED Scribe.  This patient was seen in room WTR7/WTR7 and the patient's care was started at 2:32 PM.  Chief Complaint  Patient presents with  . Laceration   The history is provided by the patient. No language interpreter was used.   HPI Comments:  Gregory GipRicky Chapman is a 33 y.o. male who presents to the Emergency Department complaining of a laceration with a kitchen knife to his left index finger while cooking earlier this morning. He states he has had a hard time getting the bleeding to stop. He denies tingling or weakness of the finger.   Past Medical History  Diagnosis Date  . HLD (hyperlipidemia)   . Chest pain   . Decreased sensation     L arm    Past Surgical History  Procedure Laterality Date  . Reconstruction medial collateral ligament elbow w/ tendon graft  6/91    broken wrist  . Wrist surgery  8/01  . Shoulder surgery  2006    bursa removed    Family History  Problem Relation Age of Onset  . Heart attack      uncle in 2660s; GF    History  Substance Use Topics  . Smoking status: Never Smoker   . Smokeless tobacco: Not on file  . Alcohol Use: Yes     Comment: 1-2 beers daily with occ binging once a month     Review of Systems  Skin: Positive for wound (laceration to left index finger).  Neurological: Negative for weakness.  All other systems reviewed and are negative.   Allergies  Codeine; Diphenhydramine hcl; Hydrocodone-acetaminophen; and Sulfamethoxazole-trimethoprim  Home Medications   Prior to Admission medications   Not on File   Triage Vitals: BP 136/87  Pulse 80  Temp(Src) 98.2 F (36.8 C) (Oral)  Resp 18  SpO2 98% Physical Exam  Nursing note and vitals reviewed. Constitutional: He is oriented to person, place, and time. He appears well-developed and  well-nourished.  HENT:  Head: Normocephalic and atraumatic.  Eyes: EOM are normal.  Neck: Normal range of motion.  Cardiovascular: Normal rate.   Pulmonary/Chest: Effort normal.  Musculoskeletal: Normal range of motion.  Neurological: He is alert and oriented to person, place, and time.  Skin: Skin is warm and dry.  Small 0.5 cm superficial laceration to the pad of left index finger.  Psychiatric: He has a normal mood and affect. His behavior is normal.    ED Course  LACERATION REPAIR Date/Time: 11/05/2013 2:40 PM Performed by: Teressa LowerPICKERING, Barack Nicodemus Authorized by: Teressa LowerPICKERING, Nakya Weyand Consent: Verbal consent obtained. Risks and benefits: risks, benefits and alternatives were discussed Consent given by: patient Patient identity confirmed: verbally with patient Body area: upper extremity Location details: left index finger Foreign bodies: no foreign bodies Irrigation solution: saline Skin closure: glue Technique: simple Approximation difficulty: simple Dressing: 4x4 sterile gauze Patient tolerance: Patient tolerated the procedure well with no immediate complications.   (including critical care time) DIAGNOSTIC STUDIES: Oxygen Saturation is 98% on RA, normal by my interpretation.   COORDINATION OF CARE: 2:34 PM- Will DermaBond laceration. Pt verbalizes understanding and agrees to plan.  Medications - No data to display  Labs Review Labs Reviewed - No data to display  Imaging Review No results found.   EKG Interpretation None      MDM  Final diagnoses:  Finger laceration    Wound closed without any problem. Tetanus utd  I personally performed the services described in this documentation, which was scribed in my presence. The recorded information has been reviewed and is accurate.    Teressa LowerVrinda Gatlin Kittell, NP 11/05/13 1441

## 2013-11-05 NOTE — ED Notes (Signed)
Pt reports that this morning he was cooking and cut his left index finger with a steak knife. Pt states that he does not having feeling in his left arm due to previous surgeries and did not notice it at first. Bleeding is controlled at this time. Pt is A/O x4, in NAD, and vitals are WDL.

## 2013-11-10 NOTE — ED Provider Notes (Signed)
Medical screening examination/treatment/procedure(s) were performed by non-physician practitioner and as supervising physician I was immediately available for consultation/collaboration.    Ellanor Feuerstein, MD 11/10/13 1901 

## 2015-09-06 ENCOUNTER — Emergency Department (HOSPITAL_COMMUNITY)
Admission: EM | Admit: 2015-09-06 | Discharge: 2015-09-06 | Disposition: A | Discharge status: Home / Self Care | Payer: No Typology Code available for payment source | Attending: Emergency Medicine | Admitting: Emergency Medicine

## 2015-09-06 ENCOUNTER — Encounter (HOSPITAL_COMMUNITY): Payer: Self-pay | Admitting: Emergency Medicine

## 2015-09-06 DIAGNOSIS — Y288XXA Contact with other sharp object, undetermined intent, initial encounter: Secondary | ICD-10-CM | POA: Insufficient documentation

## 2015-09-06 DIAGNOSIS — S61211A Laceration without foreign body of left index finger without damage to nail, initial encounter: Secondary | ICD-10-CM | POA: Insufficient documentation

## 2015-09-06 DIAGNOSIS — Z8639 Personal history of other endocrine, nutritional and metabolic disease: Secondary | ICD-10-CM | POA: Insufficient documentation

## 2015-09-06 DIAGNOSIS — S61012A Laceration without foreign body of left thumb without damage to nail, initial encounter: Secondary | ICD-10-CM

## 2015-09-06 DIAGNOSIS — Y998 Other external cause status: Secondary | ICD-10-CM | POA: Insufficient documentation

## 2015-09-06 DIAGNOSIS — Y9389 Activity, other specified: Secondary | ICD-10-CM | POA: Insufficient documentation

## 2015-09-06 DIAGNOSIS — Y9289 Other specified places as the place of occurrence of the external cause: Secondary | ICD-10-CM | POA: Insufficient documentation

## 2015-09-06 DIAGNOSIS — Z23 Encounter for immunization: Secondary | ICD-10-CM | POA: Insufficient documentation

## 2015-09-06 MED ORDER — TETANUS-DIPHTH-ACELL PERTUSSIS 5-2.5-18.5 LF-MCG/0.5 IM SUSP
0.5000 mL | Freq: Once | INTRAMUSCULAR | Status: AC
  Administered 2015-09-06: 0.5 mL via INTRAMUSCULAR
  Filled 2015-09-06: qty 0.5

## 2015-09-06 NOTE — ED Provider Notes (Signed)
CSN: [id number]     Arrival date & time 09/06/15  1518 History  By signing my name [id number], I, Gregory Chapman, attest that this documentation has been prepared under the direction and in the presence of Garlon HatchetLisa M Khaliel Morey, PA-C. Electronically Signed: Placido SouLogan Chapman, ED Scribe. 09/06/2015. 4:04 PM.   Chief Complaint  Patient presents with  . Extremity Laceration   The history is provided by the patient. No language interpreter was used.   HPI Comments: Gregory Chapman is a 35 y.o. male who presents to the Emergency Department complaining of a mild laceration with controlled bleeding to his left thumb which occurred PTA. Pt was working on a tractor and accidentally cut the affected region on the tractor blade. He reports associated, mild, pain surrounding the wound that worsens with palpation. Pt is unsure of the timing of his last TDAP booster. He denies any other associated symptoms at this time.   Past Medical History  Diagnosis Date  . HLD (hyperlipidemia)   . Chest pain   . Decreased sensation     L arm    Past Surgical History  Procedure Laterality Date  . Reconstruction medial collateral ligament elbow w/ tendon graft  6/91    broken wrist  . Wrist surgery  8/01  . Shoulder surgery  2006    bursa removed    Family History  Problem Relation Age of Onset  . Heart attack      uncle in 8660s; GF    Social History  Substance Use Topics  . Smoking status: Never Smoker   . Smokeless tobacco: None  . Alcohol Use: Yes     Comment: 1-2 beers daily with occ binging once a month     Review of Systems A complete 10 system review of systems was obtained and all systems are negative except as noted in the HPI and PMH.   Allergies  Codeine; Diphenhydramine hcl; Hydrocodone-acetaminophen; and Sulfamethoxazole-trimethoprim  Home Medications   Prior to Admission medications   Not on File   BP 161/121 mmHg  Pulse 110  Temp(Src) 98.1 F (36.7 C) (Oral)  Resp 15  SpO2 95%   Physical  Exam  Constitutional: He is oriented to person, place, and time. He appears well-developed and well-nourished.  HENT:  Head: Normocephalic and atraumatic.  Eyes: EOM are normal.  Neck: Normal range of motion.  Cardiovascular: Normal rate.   Pulmonary/Chest: Effort normal. No respiratory distress.  Abdominal: Soft.  Musculoskeletal: Normal range of motion.  1cm superficial laceration noted to palmar aspect of left thumb at IP joint; no bleeding or bony deformities noted; full flexion/extension maintained Skin tear noted to medial left index finger, no bleeding noted Strong radial pulse and cap refill; normal sensation throughout  Neurological: He is alert and oriented to person, place, and time.  Skin: Skin is warm and dry. Laceration noted.  Psychiatric: He has a normal mood and affect.  Nursing note and vitals reviewed.  ED Course  Procedures   LACERATION REPAIR Performed by: Garlon HatchetSANDERS, Siddharth Babington M Authorized by: Garlon HatchetSANDERS, Naturi Alarid M Consent: Verbal consent obtained. Risks and benefits: risks, benefits and alternatives were discussed Consent given by: patient Patient identity confirmed: provided demographic data Prepped and Draped in normal sterile fashion Wound explored  Laceration Location: left thumb  Laceration Length: 1 cm  No Foreign Bodies seen or palpated  Anesthesia: none  Local anesthetic: none  Anesthetic total: 0 ml  Irrigation method: syringe Amount of cleaning: standard  Skin closure: dermabond  Number of  sutures: 0  Technique: n/a  Patient tolerance: Patient tolerated the procedure well with no immediate complications.  DIAGNOSTIC STUDIES: Oxygen Saturation is 95% on RA, adequate by my interpretation.    COORDINATION OF CARE: 4:00 PM Discussed next steps with pt. He verbalized understanding and is agreeable with the plan.   Labs Review Labs Reviewed - No data to display  Imaging Review No results found.   EKG Interpretation None      MDM    Final diagnoses:  Thumb laceration, left, initial encounter   35 year old male here with superficial laceration to left thumb while working on a tractor. Bleeding is controlled on arrival. Laceration is 1cm, superficial in nature, localized to palmar aspect of left thumb at the level of the IP joint. He has normal range of motion of his left thumb. He also has a small skin tear to the medial aspect of his left index finger. Wounds were soaked in Betadine and saline mix. Tetanus was updated. Given the location of his wound and superficial nature, opted to repair with Dermabond to allow normal mobility of the left thumb.  Wounds dressed in ED.  Discussed home wound care.  Follow-up with PCP.  Discussed plan with patient, he/she acknowledged understanding and agreed with plan of care.  Return precautions given for new or worsening symptoms.  I personally performed the services described in this documentation, which was scribed in my presence. The recorded information has been reviewed and is accurate.  Garlon Hatchet, PA-C 09/06/15 1651  Arby Barrette, MD 09/07/15 [phone]

## 2015-09-06 NOTE — Discharge Instructions (Signed)
Keep wound clean and dry as much as possible. dermabond will slough off over the next week or so, try not to pull it off. Follow-up with your primary care doctor if you have any concerns.

## 2015-09-06 NOTE — ED Notes (Signed)
Pt states he cut his thumb while working on a tractor, bleeding controlled

## 2017-04-24 ENCOUNTER — Emergency Department (HOSPITAL_COMMUNITY): Payer: Self-pay

## 2017-04-24 ENCOUNTER — Encounter (HOSPITAL_COMMUNITY): Payer: Self-pay

## 2017-04-24 ENCOUNTER — Emergency Department (HOSPITAL_COMMUNITY)
Admission: EM | Admit: 2017-04-24 | Discharge: 2017-04-24 | Disposition: A | Discharge status: Home / Self Care | Payer: Self-pay | Attending: Emergency Medicine | Admitting: Emergency Medicine

## 2017-04-24 DIAGNOSIS — Y929 Unspecified place or not applicable: Secondary | ICD-10-CM | POA: Insufficient documentation

## 2017-04-24 DIAGNOSIS — S2242XA Multiple fractures of ribs, left side, initial encounter for closed fracture: Secondary | ICD-10-CM | POA: Insufficient documentation

## 2017-04-24 DIAGNOSIS — Y998 Other external cause status: Secondary | ICD-10-CM | POA: Insufficient documentation

## 2017-04-24 DIAGNOSIS — S42025A Nondisplaced fracture of shaft of left clavicle, initial encounter for closed fracture: Secondary | ICD-10-CM | POA: Insufficient documentation

## 2017-04-24 DIAGNOSIS — W230XXA Caught, crushed, jammed, or pinched between moving objects, initial encounter: Secondary | ICD-10-CM | POA: Insufficient documentation

## 2017-04-24 DIAGNOSIS — Y9389 Activity, other specified: Secondary | ICD-10-CM | POA: Insufficient documentation

## 2017-04-24 LAB — CBC WITH DIFFERENTIAL/PLATELET
BASOS PCT: 0 %
Basophils Absolute: 0 10*3/uL (ref 0.0–0.1)
EOS ABS: 0.1 10*3/uL (ref 0.0–0.7)
Eosinophils Relative: 2 %
HCT: 42.7 % (ref 39.0–52.0)
HEMOGLOBIN: 14.7 g/dL (ref 13.0–17.0)
LYMPHS ABS: 2.9 10*3/uL (ref 0.7–4.0)
Lymphocytes Relative: 35 %
MCH: 29 pg (ref 26.0–34.0)
MCHC: 34.4 g/dL (ref 30.0–36.0)
MCV: 84.2 fL (ref 78.0–100.0)
Monocytes Absolute: 0.5 10*3/uL (ref 0.1–1.0)
Monocytes Relative: 5 %
NEUTROS PCT: 58 %
Neutro Abs: 4.8 10*3/uL (ref 1.7–7.7)
Platelets: 207 10*3/uL (ref 150–400)
RBC: 5.07 MIL/uL (ref 4.22–5.81)
RDW: 12.9 % (ref 11.5–15.5)
WBC: 8.3 10*3/uL (ref 4.0–10.5)

## 2017-04-24 LAB — BASIC METABOLIC PANEL
Anion gap: 7 (ref 5–15)
BUN: 9 mg/dL (ref 6–20)
CHLORIDE: 103 mmol/L (ref 101–111)
CO2: 27 mmol/L (ref 22–32)
CREATININE: 0.85 mg/dL (ref 0.61–1.24)
Calcium: 8.8 mg/dL — ABNORMAL LOW (ref 8.9–10.3)
GFR calc non Af Amer: >60 mL/min (ref >60)
Glucose, Bld: 113 mg/dL — ABNORMAL HIGH (ref 65–99)
Potassium: 3.7 mmol/L (ref 3.5–5.1)
SODIUM: 137 mmol/L (ref 135–145)

## 2017-04-24 LAB — LIPASE, BLOOD: LIPASE: 32 U/L (ref 11–51)

## 2017-04-24 MED ORDER — TRAMADOL HCL 50 MG PO TABS: 100.0000 mg | ORAL_TABLET | Freq: Four times a day (QID) | ORAL | 0 refills | Status: AC | PRN

## 2017-04-24 MED ORDER — FENTANYL CITRATE (PF) 100 MCG/2ML IJ SOLN
50.0000 ug | Freq: Once | INTRAMUSCULAR | Status: AC
  Administered 2017-04-24: 50 ug via INTRAVENOUS
  Filled 2017-04-24: qty 2

## 2017-04-24 MED ORDER — SODIUM CHLORIDE 0.9 % IV BOLUS (SEPSIS)
1000.0000 mL | Freq: Once | INTRAVENOUS | Status: AC
  Administered 2017-04-24: 1000 mL via INTRAVENOUS

## 2017-04-24 MED ORDER — IOPAMIDOL (ISOVUE-300) INJECTION 61%
INTRAVENOUS | Status: AC
  Administered 2017-04-24: 75 mL
  Filled 2017-04-24: qty 75

## 2017-04-24 NOTE — ED Provider Notes (Signed)
[facility] EMERGENCY DEPARTMENT Provider Note   CSN: [id number] Arrival date & time: 04/24/17  2025     History   Chief Complaint Chief Complaint  Patient presents with  . Chest Pain    HPI Gregory Chapman is a 35 y.o. male.  HPI 36 year old male with a history of hyperlipidemia and decrease sensation in the left arm from a prior traumatic injury presenting after being pinned between 2 tractors.  He states that he was on his hands and knees trying to attach a chain to the front of one tractor when another tractor that he thought was in gear slowly rolled towards him and pinned him.  He was caught between the tire of one tractor in the body of the next.  He states that the tire hit his left upper arm the right upper arm was against the body of the next tractor.  He felt 2 different "pops".  He endorses pain across his chest and in his bilateral shoulders but denies headache, neck pain, back pain, shortness of breath, abdominal pain.  Did not hit his head.  No LOC.  Was pinned for approximately 30 seconds before his friend moved the tractor.  States his pain has greatly decreased after EMS gave fentanyl.  Palpation of his shoulders and chest wall makes the pain worse.  Past Medical History:  Diagnosis Date  . Chest pain   . Decreased sensation    L arm   . HLD (hyperlipidemia)     Patient Active Problem List   Diagnosis Date Noted  . GERD 11/23/2008  . HYPERLIPIDEMIA 04/09/2007  . OBESITY, UNSPECIFIED 04/09/2007  . TRAUMATIC ARTHROPATHY SITE UNSPECIFIED 04/09/2007    Past Surgical History:  Procedure Laterality Date  . RECONSTRUCTION MEDIAL COLLATERAL LIGAMENT ELBOW W/ TENDON GRAFT  6/91   broken wrist  . SHOULDER SURGERY  2006   bursa removed   . WRIST SURGERY  8/01       Home Medications    Prior to Admission medications   Medication Sig Start Date End Date Taking? Authorizing Provider  traMADol (ULTRAM) 50 MG tablet Take 2 tablets (100 mg  total) by mouth every 6 (six) hours as needed for up to 4 days for severe pain. 04/24/17 04/28/17  Drevion Offord [place], MD    Family History Family History  Problem Relation Age of Onset  . Heart attack Unknown        uncle in 64s; GF     Social History Social History   Tobacco Use  . Smoking status: Never Smoker  . Smokeless tobacco: Never Used  Substance Use Topics  . Alcohol use: Yes    Comment: 1-2 beers daily with occ binging once a month   . Drug use: No     Allergies   Codeine; Hydrocodone-acetaminophen; Sulfamethoxazole-trimethoprim; and Diphenhydramine hcl   Review of Systems Review of Systems  Constitutional: Negative for chills and fever.  HENT: Negative for ear pain and sore throat.   Eyes: Negative for pain and visual disturbance.  Respiratory: Negative for cough and shortness of breath.   Cardiovascular: Positive for chest pain. Negative for palpitations.  Gastrointestinal: Negative for abdominal pain and vomiting.  Genitourinary: Negative for dysuria and hematuria.  Musculoskeletal: Negative for arthralgias and back pain.       B/l shoulder pain  Skin: Negative for color change and rash.  Neurological: Negative for seizures and syncope.  All other systems reviewed and are negative.    Physical Exam Updated  Vital Signs BP (!) 135/93   Pulse 89   Temp 98.5 F (36.9 C) (Tympanic)   Resp 18   SpO2 100%   Physical Exam  Constitutional: He is oriented to person, place, and time. He appears well-developed and well-nourished.  HENT:  Head: Normocephalic and atraumatic.  Eyes: Conjunctivae and EOM are normal. Pupils are equal, round, and reactive to light.  Neck: Neck supple.  Cardiovascular: Normal rate, regular rhythm and normal pulses.  No murmur heard. Pulses:      Radial pulses are 2+ on the right side, and 2+ on the left side.  Pulmonary/Chest: Effort normal and breath sounds normal. No accessory muscle usage. No tachypnea. No respiratory  distress. He has no decreased breath sounds. He exhibits tenderness (to b/l lateral chest wall). He exhibits no crepitus and no deformity.    Abdominal: Soft. There is no tenderness.  Musculoskeletal: He exhibits no edema.       Right shoulder: He exhibits decreased range of motion, tenderness and pain. He exhibits no deformity, normal pulse and normal strength.       Left shoulder: He exhibits decreased range of motion, tenderness and pain. He exhibits no deformity, normal pulse and normal strength.  Neurological: He is alert and oriented to person, place, and time. He has normal strength. No cranial nerve deficit or sensory deficit. GCS eye subscore is 4. GCS verbal subscore is 5. GCS motor subscore is 6.  Skin: Skin is warm and dry.  Psychiatric: He has a normal mood and affect.  Nursing note and vitals reviewed.    ED Treatments / Results  Labs (all labs ordered are listed, but only abnormal results are displayed) Labs Reviewed  BASIC METABOLIC PANEL - Abnormal; Notable for the following components:      Result Value   Glucose, Bld 113 (*)    Calcium 8.8 (*)    All other components within normal limits  CBC WITH DIFFERENTIAL/PLATELET  LIPASE, BLOOD  URINALYSIS, ROUTINE W REFLEX MICROSCOPIC    EKG  EKG Interpretation  Date/Time:  Thursday April 24 2017 20:28:48 EST Ventricular Rate:  86 PR Interval:    QRS Duration: 111 QT Interval:  358 QTC Calculation: 429 R Axis:   60 Text Interpretation:  Sinus rhythm Abnormal inferior Q waves ST elevation, consider anterior injury rate is slower compared to prior ekg Confirmed by Jacalyn LefevreHaviland, Julie [phone]([id number]) on 04/24/2017 8:32:37 PM       Radiology Dg Shoulder Right  Result Date: 04/24/2017 CLINICAL DATA:  36 y/o  M; crush injury. EXAM: RIGHT SHOULDER - 2+ VIEW COMPARISON:  04/24/2017 CT chest FINDINGS: There is no evidence of fracture or dislocation. There is no evidence of arthropathy or other focal bone abnormality. Soft  tissues are unremarkable. IMPRESSION: Negative. Electronically Signed   By: Mitzi HansenLance  Furusawa-Stratton M.D.   On: 04/24/2017 21:48   Ct Chest W Contrast  Result Date: 04/24/2017 CLINICAL DATA:  Chest pain after trauma, blunt low energy. Patient reports being crushed between 2 tractors today. EXAM: CT CHEST WITH CONTRAST TECHNIQUE: Multidetector CT imaging of the chest was performed during intravenous contrast administration. CONTRAST:  75mL ISOVUE-300 IOPAMIDOL (ISOVUE-300) INJECTION 61% COMPARISON:  Chest CT 10/12/2009, no recent radiographs. FINDINGS: Cardiovascular: No acute aortic injury. Heart size upper normal for age. No pericardial fluid. Mediastinum/Nodes: No mediastinal hemorrhage or hematoma. No pneumomediastinum. No adenopathy. Visualized thyroid gland is normal. The esophagus is decompressed. Lungs/Pleura: No pneumothorax. Faint peripheral ground-glass opacities in the lingula and anterior left lower lobe and  lateral right lower lobe may reflect minimal contusion. No pleural fluid. Upper Abdomen: No evidence of traumatic injury or acute abnormality. No upper abdominal free fluid. Musculoskeletal: Acute nondisplaced posterior left first and second rib fractures. Nondisplaced minimally comminuted midshaft left clavicle fracture. Fracture of the right anterior first rib at the chondro sternal junction, air/vacuum phenomenon tracks in the adjacent soft tissues. Irregularity of many anterior ribs at the chondro vertebral junction may reflect normal anatomy versus buckle fractures. No acute fracture of the sternum or thoracic spine. IMPRESSION: 1. Nondisplaced posterior left first and second rib fractures. Minimally displaced fracture right anterior first rib at the chondro sternal junction. 2. Left midshaft clavicle fracture. 3. No pneumothorax. Faint peripheral ground-glass opacities in both lungs may reflect minimal contusion. Electronically Signed   By: Rubye OaksMelanie  Ehinger M.D.   On: 04/24/2017 22:13    Ct Cervical Spine Wo Contrast  Result Date: 04/24/2017 CLINICAL DATA:  36 y/o  M; crush injury.  Chest and neck pain. EXAM: CT CERVICAL SPINE WITHOUT CONTRAST TECHNIQUE: Multidetector CT imaging of the cervical spine was performed without intravenous contrast. Multiplanar CT image reconstructions were also generated. COMPARISON:  None. FINDINGS: Alignment: Normal. Skull base and vertebrae: No acute fracture. No primary bone lesion or focal pathologic process. Soft tissues and spinal canal: No prevertebral fluid or swelling. No visible canal hematoma. Disc levels:  No significant cervical spine spondylosis. Upper chest: Negative. Other: Small left mastoid fluid level and right mastoid opacification. Nondisplaced left posterior first rib fracture. Nondisplaced left mid clavicle fracture. IMPRESSION: 1. No acute fracture or malalignment the cervical spine. 2. Left first posterior rib and left mid clavicle nondisplaced acute fractures. Electronically Signed   By: Mitzi HansenLance  Furusawa-Stratton M.D.   On: 04/24/2017 22:06   Dg Pelvis Portable  Result Date: 04/24/2017 CLINICAL DATA:  Crushed between 2 tractors. EXAM: PORTABLE PELVIS 1-2 VIEWS COMPARISON:  None. FINDINGS: There is no evidence of pelvic fracture or diastasis. No pelvic bone lesions are seen. Contrast in the urinary bladder. IMPRESSION: Negative. Electronically Signed   By: Awilda Metroourtnay  Bloomer M.D.   On: 04/24/2017 22:45   Dg Shoulder Left  Result Date: 04/24/2017 CLINICAL DATA:  36 y/o  M; crush injury. EXAM: LEFT SHOULDER - 2+ VIEW COMPARISON:  04/24/2017 CT chest. FINDINGS: Nondisplaced midshaft clavicle fracture. No shoulder dislocation. Acromioclavicular and coracoclavicular intervals are normal. IMPRESSION: Nondisplaced left midshaft clavicle fracture. Electronically Signed   By: Mitzi HansenLance  Furusawa-Stratton M.D.   On: 04/24/2017 21:51    Procedures Procedures (including critical care time)  Medications Ordered in ED Medications  sodium  chloride 0.9 % bolus 1,000 mL (1,000 mLs Intravenous New Bag/Given 04/24/17 2037)  iopamidol (ISOVUE-300) 61 % injection (75 mLs  Contrast Given 04/24/17 2121)  fentaNYL (SUBLIMAZE) injection 50 mcg (50 mcg Intravenous Given 04/24/17 2253)     Initial Impression / Assessment and Plan / ED Course  I have reviewed the triage vital signs and the nursing notes.  Pertinent labs & imaging results that were available during my care of the patient were reviewed by me and considered in my medical decision making (see chart for details).     36yoM presenting after being pinned between two tractors. On arrival, ABCs intact, No obvious deformity to arms, or chest. Breath sounds equal b/l. AF and HDS. Concern for chest trauma or broken clavicles. CT chest, C spine ordered. XR or b/l shoulders ordered and pelvic xr ordered.  CBC, BMP, Lipase ordered.  Labs grossly unremarkable.  XR unremarkable. CT shows left  clavicle fx and L 1st and 2nd rib fx. No PTX seen. C spine cleared.  Patient will be given incentive spirometer and a sling for his left arm.  Patient instructed to follow-up with orthopedics and his PCP.  Patient states he is very sensitive to hydrocodone and does not want hydrocodone or oxycodone.  We will try to treat his pain with tramadol.  We will have the patient follow-up with his PCP for evaluation of pain management.  Patient amenable with this plan.  Strict return precautions given.  Final Clinical Impressions(s) / ED Diagnoses   Final diagnoses:  Nondisplaced fracture of shaft of left clavicle, initial encounter for closed fracture  Closed fracture of multiple ribs of left side, initial encounter    ED Discharge Orders        Ordered    traMADol (ULTRAM) 50 MG tablet  Every 6 hours PRN     04/24/17 2304       Mirra Basilio [place], MD 04/24/17 2318    Jacalyn Lefevre, MD 04/28/17 [phone]

## 2017-04-24 NOTE — ED Triage Notes (Signed)
Pt arrived via [facility] from home pt was pinched across the chest for about 30 seconds between 2 tractors while working on them.

## 2017-04-24 NOTE — Discharge Instructions (Signed)
Please only take 1 tramadol first to see how you tolerate it. If you are still in pain, you can take another. Please see your PCP if this does not control your pain. Due to your allergies, this is the strongest medicine I can give you at this time. Please take 800mg  motrin every 6 hours as well. Please return with worsening chest pain, SOB, difficulty breathing or any other concerns.

## 2017-04-24 NOTE — ED Notes (Signed)
Pt stable, ambulatory, states understanding of discharge instructions 

## 2017-04-25 NOTE — ED Notes (Signed)
50mcg Fentanyl wasted in sharps with Delrae RendBrittney O, RN and Wynell BalloonMorgan B, RN

## 2017-05-05 ENCOUNTER — Ambulatory Visit: Payer: Self-pay | Admitting: Family Medicine
